# Patient Record
Sex: Male | Born: 1970 | Race: White | Hispanic: No | Marital: Single | Smoking: Former smoker
Health system: Southern US, Community
[De-identification: ages and names within clinical notes are randomized; demographics above are authoritative.]

---

## 2016-02-05 ENCOUNTER — Encounter (HOSPITAL_COMMUNITY): Payer: Self-pay | Admitting: Emergency Medicine

## 2016-02-05 ENCOUNTER — Emergency Department (HOSPITAL_COMMUNITY)
Admission: EM | Admit: 2016-02-05 | Discharge: 2016-02-05 | Disposition: A | Discharge status: Home / Self Care | Payer: Self-pay | Attending: Emergency Medicine | Admitting: Emergency Medicine

## 2016-02-05 DIAGNOSIS — R1110 Vomiting, unspecified: Secondary | ICD-10-CM | POA: Insufficient documentation

## 2016-02-05 DIAGNOSIS — R000 Tachycardia, unspecified: Secondary | ICD-10-CM | POA: Insufficient documentation

## 2016-02-05 DIAGNOSIS — Y929 Unspecified place or not applicable: Secondary | ICD-10-CM | POA: Insufficient documentation

## 2016-02-05 DIAGNOSIS — W260XXA Contact with knife, initial encounter: Secondary | ICD-10-CM | POA: Insufficient documentation

## 2016-02-05 DIAGNOSIS — F17210 Nicotine dependence, cigarettes, uncomplicated: Secondary | ICD-10-CM | POA: Insufficient documentation

## 2016-02-05 DIAGNOSIS — S61219A Laceration without foreign body of unspecified finger without damage to nail, initial encounter: Secondary | ICD-10-CM

## 2016-02-05 DIAGNOSIS — Y9389 Activity, other specified: Secondary | ICD-10-CM | POA: Insufficient documentation

## 2016-02-05 DIAGNOSIS — S61211A Laceration without foreign body of left index finger without damage to nail, initial encounter: Secondary | ICD-10-CM | POA: Insufficient documentation

## 2016-02-05 DIAGNOSIS — Y999 Unspecified external cause status: Secondary | ICD-10-CM | POA: Insufficient documentation

## 2016-02-05 MED ORDER — POVIDONE-IODINE 10 % EX SOLN
CUTANEOUS | Status: AC
  Filled 2016-02-05: qty 118

## 2016-02-05 NOTE — ED Triage Notes (Signed)
Pt reports cutting LT index finger with a razor while he was cutting wood. Bleeding controlled with pressure. Pt pale and hypotensive. BP 86/55.

## 2016-02-05 NOTE — ED Notes (Signed)
Pt given ginger ale to drink. 

## 2016-02-05 NOTE — ED Provider Notes (Signed)
[facility]-EMERGENCY DEPT Provider Note   CSN: [id number] Arrival date & time: 02/05/16  1826  First Provider Contact:  First MD Initiated Contact with Patient 02/05/16 1929        History   Chief Complaint Chief Complaint  Patient presents with  . Laceration    HPI Albert Parker is a 45 y.o. male who presents to the ED with a laceration to the index finger of the left hand. Patient reports that he was using a razor knife and it slipped and cut his finger. He reports that after he saw the blood he felt light headed and nauseated. He vomited x1. On arrival to the ED his blood pressure was low and he appeared pale. After he was taken to the treatment room he reported feeling better.   The history is provided by the patient. No language interpreter was used.  Laceration   The incident occurred less than 1 hour ago. The laceration is located on the left hand. The laceration is 1 cm in size. The laceration mechanism was a a razor. The pain is at a severity of 2/10. The pain has been constant since onset. He reports no foreign bodies present.    History reviewed. No pertinent past medical history.  There are no active problems to display for this patient.   History reviewed. No pertinent surgical history.     Home Medications    Prior to Admission medications   Not on File    Family History History reviewed. No pertinent family history.  Social History Social History  Substance Use Topics  . Smoking status: Current Every Day Smoker    Packs/day: 1.00    Types: Cigarettes  . Smokeless tobacco: Never Used  . Alcohol use No     Allergies   Review of patient's allergies indicates no known allergies.   Review of Systems Review of Systems  Gastrointestinal: Positive for nausea (resloved). Vomiting: x1.  Skin: Positive for wound.  Neurological: Positive for light-headedness.  all other systems negative   Physical Exam Updated Vital Signs BP 104/71   Pulse 67   Temp  97.8 F (36.6 C) (Oral)   Resp 18   Ht 5\' 7"  (1.702 m)   Wt 52.2 kg   SpO2 100%   BMI 18.01 kg/m   Physical Exam  Constitutional: He is oriented to person, place, and time. He appears well-developed and well-nourished. No distress.  HENT:  Head: Normocephalic.  Eyes: EOM are normal.  Neck: Neck supple.  Cardiovascular: Regular rhythm.  Tachycardia present.   Pulmonary/Chest: Effort normal and breath sounds normal.  Musculoskeletal: Normal range of motion.       Left hand: He exhibits laceration. He exhibits normal range of motion and normal capillary refill. Normal strength noted.       Hands: Flap laceration to the tip of the left index finger.   Neurological: He is alert and oriented to person, place, and time. No cranial nerve deficit.  Skin: Capillary refill takes less than 2 seconds.  Skin was pale on arrival but improved soon after placed in exam room. Superficial flap laceration to the left index finger.   Psychiatric: His behavior is normal. Thought content normal.  Nursing note and vitals reviewed.    ED Treatments / Results  Labs (all labs ordered are listed, but only abnormal results are displayed) Labs Reviewed - No data to display   Radiology No results found.  Procedures Procedures (including critical care time) Cleaned with betadine and irrigated  with NSS, patient declined sutures. Steri strips placed and dermabond applied.  Patient reports that he thinks tetanus is UTD and declines updating it tonight.   Medications Ordered in ED Medications  povidone-iodine (BETADINE) 10 % external solution (not administered)     Initial Impression / Assessment and Plan / ED Course  I have reviewed the triage vital signs and the nursing notes.  Pertinent labs & imaging results that were available during my care of the patient were reviewed by me and considered in my medical decision making (see chart for details).  Clinical Course  Discussed with the patient  plan of careand all questioned fully answered. He will return if any problems arise.   Final Clinical Impressions(s) / ED Diagnoses   Final diagnoses:  Laceration of finger, initial encounter    New Prescriptions New Prescriptions   No medications on file     [facility], NP 02/05/16 2027    Bethann Berkshire, MD 02/05/16 2232

## 2019-01-04 ENCOUNTER — Emergency Department (HOSPITAL_COMMUNITY)
Admission: EM | Admit: 2019-01-04 | Discharge: 2019-01-04 | Disposition: A | Discharge status: Home / Self Care | Payer: Self-pay | Attending: Emergency Medicine | Admitting: Emergency Medicine

## 2019-01-04 ENCOUNTER — Emergency Department (HOSPITAL_COMMUNITY): Payer: Self-pay

## 2019-01-04 ENCOUNTER — Other Ambulatory Visit: Payer: Self-pay

## 2019-01-04 ENCOUNTER — Encounter (HOSPITAL_COMMUNITY): Payer: Self-pay | Admitting: Emergency Medicine

## 2019-01-04 DIAGNOSIS — R42 Dizziness and giddiness: Secondary | ICD-10-CM | POA: Insufficient documentation

## 2019-01-04 DIAGNOSIS — R0609 Other forms of dyspnea: Secondary | ICD-10-CM | POA: Insufficient documentation

## 2019-01-04 DIAGNOSIS — R55 Syncope and collapse: Secondary | ICD-10-CM | POA: Insufficient documentation

## 2019-01-04 DIAGNOSIS — Z87891 Personal history of nicotine dependence: Secondary | ICD-10-CM | POA: Insufficient documentation

## 2019-01-04 LAB — TROPONIN I (HIGH SENSITIVITY)
Troponin I (High Sensitivity): <2 ng/L (ref <18)
Troponin I (High Sensitivity): <2 ng/L (ref <18)

## 2019-01-04 LAB — CBC WITH DIFFERENTIAL/PLATELET
Abs Immature Granulocytes: 0.03 10*3/uL (ref 0.00–0.07)
Basophils Absolute: 0.1 10*3/uL (ref 0.0–0.1)
Basophils Relative: 1 %
Eosinophils Absolute: 0.6 10*3/uL — ABNORMAL HIGH (ref 0.0–0.5)
Eosinophils Relative: 7 %
HCT: 44 % (ref 39.0–52.0)
Hemoglobin: 14.8 g/dL (ref 13.0–17.0)
Immature Granulocytes: 0 %
Lymphocytes Relative: 39 %
Lymphs Abs: 3.5 10*3/uL (ref 0.7–4.0)
MCH: 30.8 pg (ref 26.0–34.0)
MCHC: 33.6 g/dL (ref 30.0–36.0)
MCV: 91.5 fL (ref 80.0–100.0)
Monocytes Absolute: 0.8 10*3/uL (ref 0.1–1.0)
Monocytes Relative: 9 %
Neutro Abs: 4 10*3/uL (ref 1.7–7.7)
Neutrophils Relative %: 44 %
Platelets: 251 10*3/uL (ref 150–400)
RBC: 4.81 MIL/uL (ref 4.22–5.81)
RDW: 12.8 % (ref 11.5–15.5)
WBC: 9 10*3/uL (ref 4.0–10.5)
nRBC: 0 % (ref 0.0–0.2)

## 2019-01-04 LAB — BASIC METABOLIC PANEL
Anion gap: 12 (ref 5–15)
BUN: 13 mg/dL (ref 6–20)
CO2: 25 mmol/L (ref 22–32)
Calcium: 9.2 mg/dL (ref 8.9–10.3)
Chloride: 102 mmol/L (ref 98–111)
Creatinine, Ser: 0.93 mg/dL (ref 0.61–1.24)
GFR calc Af Amer: >60 mL/min (ref >60)
GFR calc non Af Amer: >60 mL/min (ref >60)
Glucose, Bld: 101 mg/dL — ABNORMAL HIGH (ref 70–99)
Potassium: 3.3 mmol/L — ABNORMAL LOW (ref 3.5–5.1)
Sodium: 139 mmol/L (ref 135–145)

## 2019-01-04 LAB — BRAIN NATRIURETIC PEPTIDE: B Natriuretic Peptide: 33 pg/mL (ref 0.0–100.0)

## 2019-01-04 NOTE — Discharge Instructions (Signed)
Get help right away if: °Your shortness of breath gets worse. °You have shortness of breath when you are resting. °You feel light-headed or you faint. °You have a cough that is not controlled with medicines. °You cough up blood. °You have pain with breathing. °You have pain in your chest, arms, shoulders, or abdomen. °You have a fever. °You cannot walk up stairs or exercise the way that you normally do. °

## 2019-01-04 NOTE — ED Triage Notes (Signed)
Patient complaining of shortness of breath and dizziness "for a few weeks." Denies pain.

## 2019-01-04 NOTE — ED Provider Notes (Signed)
[facility] EMERGENCY DEPARTMENT Provider Note   CSN: [id number] Arrival date & time: 01/04/19  1717     History   Chief Complaint Chief Complaint  Patient presents with  . Shortness of Breath    HPI Albert Parker is a 48 y.o. male who presents with orthostatic light headedness and exertional dyspnea. The patient states that he has been having shortness of breath for the past 2 weeks.  He states that he thinks he is "out of shape."  He states that he believes it is because he has been smoking his whole life and he quit smoking about 1 week ago because of this.  He denies wheezing, chest pain.  He states that he feels winded when he exerts himself heavily such as pushing a writing walk lawnmower up a hill.  He denies unilateral leg swelling, hemoptysis, recent confinement or surgeries.  Patient also states that he has been feeling lightheaded with standing.  He states that this is been increasing over the past week.  He denies melena, hematochezia, other forms of volume loss such as nausea vomiting or diarrhea.  He denies racing or fluttering in his heart.     HPI  History reviewed. No pertinent past medical history.  There are no active problems to display for this patient.   History reviewed. No pertinent surgical history.      Home Medications    Prior to Admission medications   Not on File    Family History History reviewed. No pertinent family history.  Social History Social History   Tobacco Use  . Smoking status: Former Smoker    Packs/day: 1.00    Types: Cigarettes    Quit date: 12/21/2018    Years since quitting: 0.0  . Smokeless tobacco: Never Used  Substance Use Topics  . Alcohol use: Yes    Comment: rarely  . Drug use: No     Allergies   Patient has no known allergies.   Review of Systems Review of Systems Ten systems reviewed and are negative for acute change, except as noted in the HPI.    Physical Exam Updated Vital Signs BP 118/84 (BP  Location: Left Arm)   Pulse 68   Temp 98 F (36.7 C) (Oral)   Resp 16   Ht 5\' 7"  (1.702 m)   Wt 55.8 kg   SpO2 100%   BMI 19.26 kg/m   Physical Exam Vitals signs and nursing note reviewed.  Constitutional:      General: He is not in acute distress.    Appearance: He is well-developed. He is not diaphoretic.  HENT:     Head: Normocephalic and atraumatic.  Eyes:     General: No scleral icterus.    Conjunctiva/sclera: Conjunctivae normal.  Neck:     Musculoskeletal: Normal range of motion and neck supple.  Cardiovascular:     Rate and Rhythm: Normal rate and regular rhythm.     Heart sounds: Normal heart sounds.  Pulmonary:     Effort: Pulmonary effort is normal. No respiratory distress.     Breath sounds: Normal breath sounds. No wheezing, rhonchi or rales.  Abdominal:     Palpations: Abdomen is soft.     Tenderness: There is no abdominal tenderness.  Musculoskeletal:     Right lower leg: No edema.     Left lower leg: No edema.  Skin:    General: Skin is warm and dry.  Neurological:     Mental Status: He is alert.  Psychiatric:        Behavior: Behavior normal.      ED Treatments / Results  Labs (all labs ordered are listed, but only abnormal results are displayed) Labs Reviewed  CBC WITH DIFFERENTIAL/PLATELET - Abnormal; Notable for the following components:      Result Value   Eosinophils Absolute 0.6 (*)    All other components within normal limits  BASIC METABOLIC PANEL - Abnormal; Notable for the following components:   Potassium 3.3 (*)    Glucose, Bld 101 (*)    All other components within normal limits  TROPONIN I (HIGH SENSITIVITY)  BRAIN NATRIURETIC PEPTIDE  TROPONIN I (HIGH SENSITIVITY)    EKG EKG Interpretation  Date/Time:  Thursday January 04 2019 17:33:32 EDT Ventricular Rate:  69 PR Interval:  134 QRS Duration: 86 QT Interval:  396 QTC Calculation: 424 R Axis:   100 Text Interpretation:  Normal sinus rhythm Rightward axis Borderline  ECG No old tracing to compare Reconfirmed by Mancel BaleWentz, Elliott [phone]([id number]) on 01/04/2019 5:47:56 PM   Radiology Dg Chest 2 View  Result Date: 01/04/2019 CLINICAL DATA:  Shortness of breath.  Weakness.  Dizziness. EXAM: CHEST - 2 VIEW COMPARISON:  None. FINDINGS: The heart size and mediastinal contours are within normal limits. Both lungs are clear. The visualized skeletal structures are unremarkable. IMPRESSION: No active cardiopulmonary disease. Electronically Signed   By: Katherine Mantlehristopher  Green M.D.   On: 01/04/2019 17:49    Procedures Procedures (including critical care time)  Medications Ordered in ED Medications - No data to display   Initial Impression / Assessment and Plan / ED Course  I have reviewed the triage vital signs and the nursing notes.  Pertinent labs & imaging results that were available during my care of the patient were reviewed by me and considered in my medical decision making (see chart for details).        CC:SOB VS:  Vitals:   01/04/19 1930 01/04/19 2135 01/04/19 2242 01/04/19 2245  BP: 115/89 118/84 129/77   Pulse: 72 68 68   Resp:  16  18  Temp:  98 F (36.7 C)  99.1 F (37.3 C)  TempSrc:  Oral  Oral  SpO2: 100% 100% 100%   Weight:      Height:        UE:[id number]:History is gathered by patient  and EMR. DDX:The emergent differential diagnosis for shortness of breath includes, but is not limited to, Pulmonary edema, bronchoconstriction, Pneumonia, Pulmonary embolism, Pneumotherax/ Hemothorax, Dysrythmia, ACS.   Labs: I reviewed the labs which show 2- high-sensitivity troponins, negative BNP, potassium mildly decreased, CBC without abnormality Imaging: I personally reviewed the images  2v cxr) which show(s) No acute abnormalities EKG:  EKG Interpretation  Date/Time:  Thursday January 04 2019 17:33:32 EDT Ventricular Rate:  69 PR Interval:  134 QRS Duration: 86 QT Interval:  396 QTC Calculation: 424 R Axis:   100 Text Interpretation:  Normal sinus rhythm  Rightward axis Borderline ECG No old tracing to compare Reconfirmed by Mancel BaleWentz, Elliott [phone]) on 01/04/2019 5:47:56 PM       MDM: Patient here with vague dyspneic symptoms.  He does seem to make him exertional in nature.  No chest pain.  Work appears negative today for any acute abnormalities.  Suggest that the patient follow closely with his PCP also with cardiology for cardiac stress testing.  Have encouraged the patient to continue with his cessation of smoking.  Patient appears appropriate for discharge at this time Patient disposition:  Discharge Patient condition: Good. The patient appears reasonably screened and/or stabilized for discharge and I doubt any other medical condition or other [facility] requiring further screening, evaluation, or treatment in the ED at this time prior to discharge. I have discussed lab and/or imaging findings with the patient and answered all questions/concerns to the best of my ability. I have discussed return precautions and OP follow up.      Final Clinical Impressions(s) / ED Diagnoses   Final diagnoses:  None    ED Discharge Orders    None       Margarita Mail, PA-C 01/05/19 2002    Daleen Bo, MD 01/05/19 2321

## 2019-01-04 NOTE — ED Provider Notes (Signed)
22:00: Assumed care from Margarita Mail PA-C at change of shift pending delta troponin--> if negative DC home w/ prepared discharge instructions.   Delta trop negative.  Vitals WNL  Discharge home per prior provider.   I discussed results, treatment plan, need for follow-up, and return precautions with the patient as well as his family member @ his request. Provided opportunity for questions, patient & his family member confirmed understanding and are in agreement with plan.  Vitals:   01/04/19 2242 01/04/19 2245  BP: 129/77   Pulse: 68   Resp:  18  Temp:  99.1 F (37.3 C)  SpO2: 100%       Amaryllis Dyke, PA-C 01/04/19 2355    Daleen Bo, MD 01/09/19 1544

## 2019-01-04 NOTE — ED Notes (Signed)
Pt standing at the door looking out into the hallway at this time. No distress noted.

## 2019-01-04 NOTE — ED Notes (Signed)
Pt frequently pacing floor in room. Repeatedly comes to the door to look around.

## 2021-01-31 IMAGING — DX CHEST - 2 VIEW
2 series · 2 of 2 positions shown · non-contrast
Comparison: None.

CLINICAL DATA: Shortness of breath.  Weakness.  Dizziness.

EXAM:
CHEST - 2 VIEW

[chest pa]
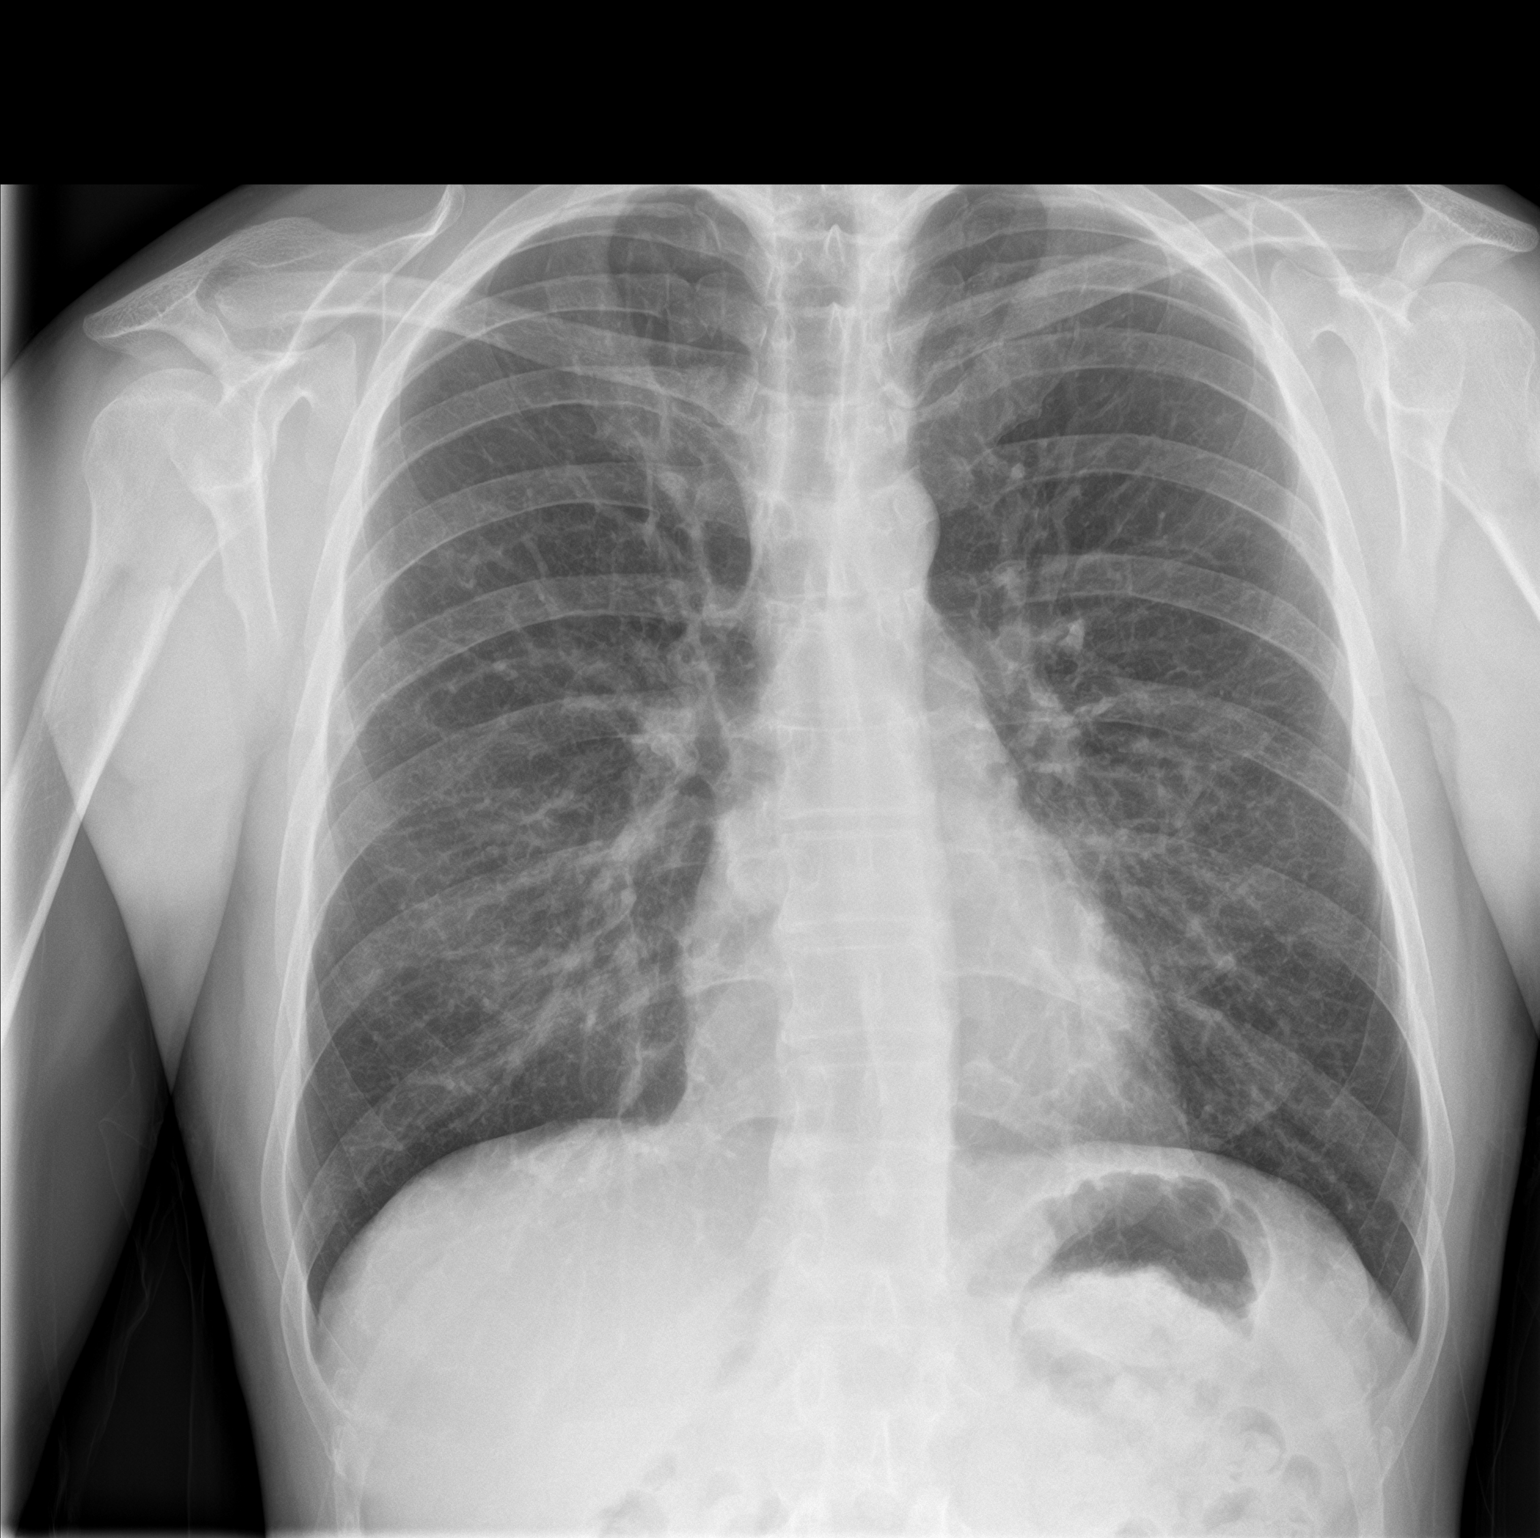

[chest lat]
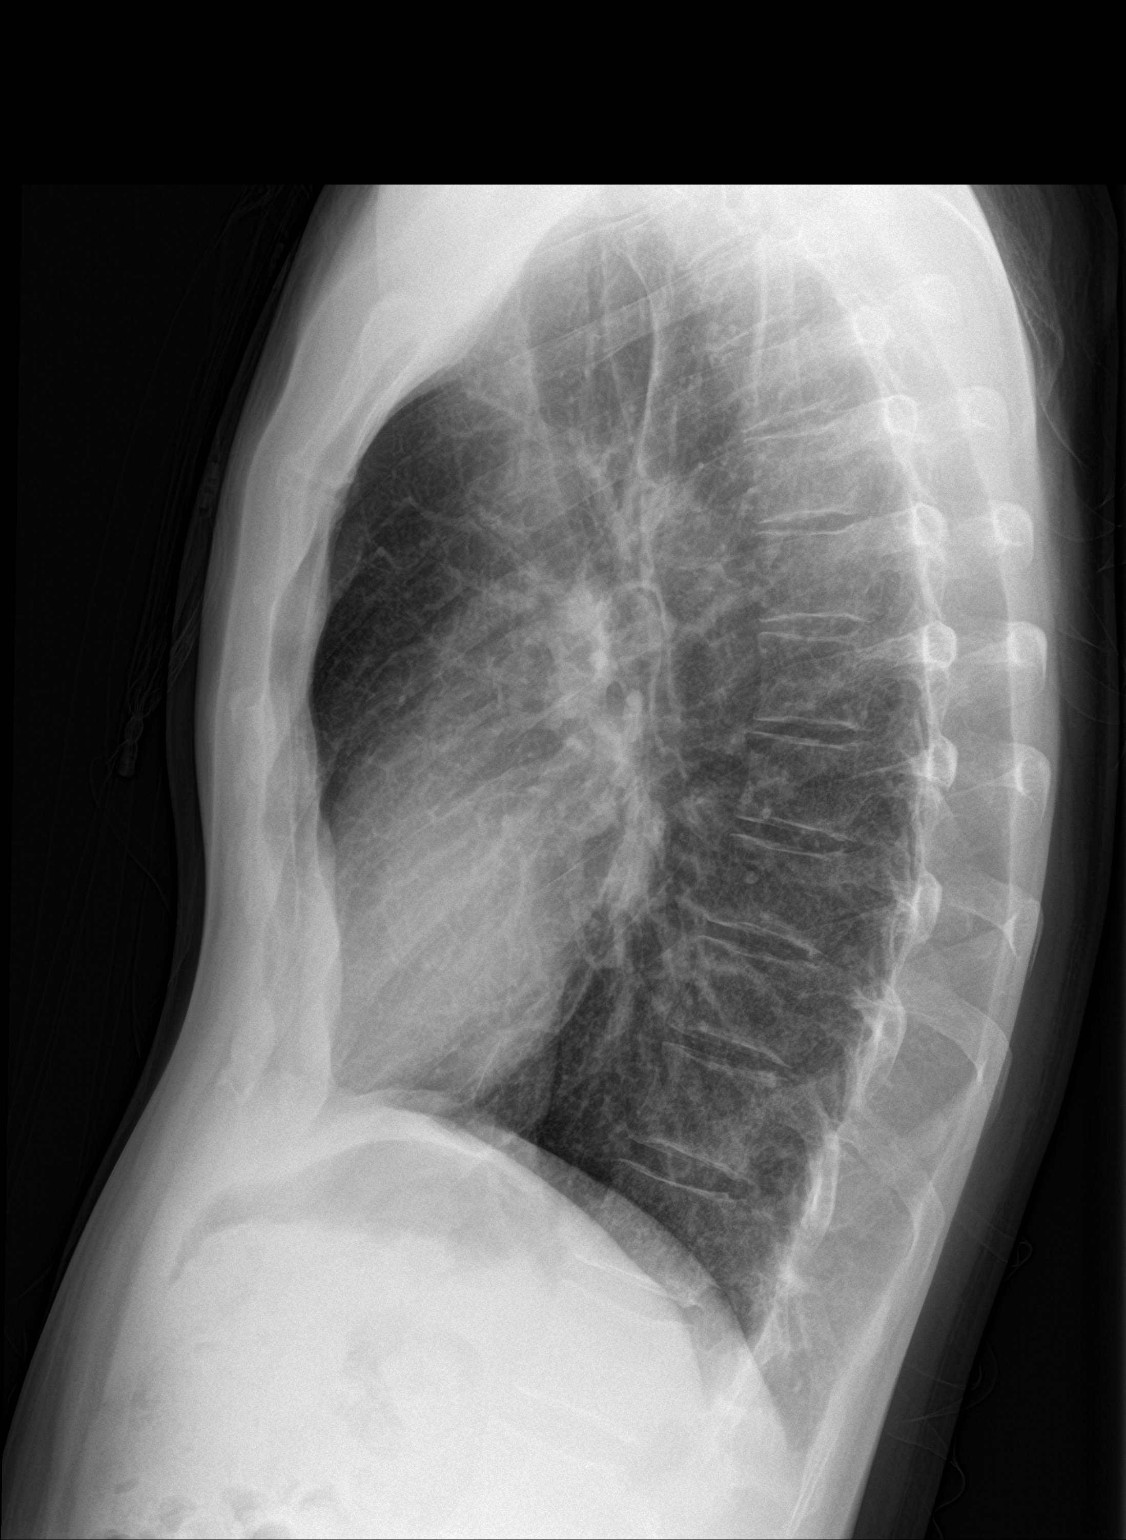

[2 of 2 positions shown; findings below may reference images not displayed]

FINDINGS: The heart size and mediastinal contours are within normal limits.
Both lungs are clear. The visualized skeletal structures are
unremarkable.
IMPRESSION: No active cardiopulmonary disease.

## 2022-11-05 DIAGNOSIS — N50819 Testicular pain, unspecified: Secondary | ICD-10-CM | POA: Diagnosis not present

## 2022-11-21 DIAGNOSIS — R1032 Left lower quadrant pain: Secondary | ICD-10-CM | POA: Diagnosis not present
